# Patient Record
Sex: Male | Born: 1955 | Race: White | Hispanic: No | Marital: Married | Smoking: Never smoker
Health system: Southern US, Community
[De-identification: ages and names within clinical notes are randomized; demographics above are authoritative.]

## PROBLEM LIST (undated history)

## (undated) DIAGNOSIS — K649 Unspecified hemorrhoids: Secondary | ICD-10-CM

## (undated) DIAGNOSIS — C61 Malignant neoplasm of prostate: Secondary | ICD-10-CM

## (undated) DIAGNOSIS — K219 Gastro-esophageal reflux disease without esophagitis: Secondary | ICD-10-CM

## (undated) DIAGNOSIS — R200 Anesthesia of skin: Secondary | ICD-10-CM

## (undated) DIAGNOSIS — J302 Other seasonal allergic rhinitis: Secondary | ICD-10-CM

## (undated) DIAGNOSIS — F419 Anxiety disorder, unspecified: Secondary | ICD-10-CM

## (undated) DIAGNOSIS — R202 Paresthesia of skin: Secondary | ICD-10-CM

## (undated) DIAGNOSIS — I10 Essential (primary) hypertension: Secondary | ICD-10-CM

## (undated) DIAGNOSIS — R7989 Other specified abnormal findings of blood chemistry: Secondary | ICD-10-CM

## (undated) DIAGNOSIS — E7800 Pure hypercholesterolemia, unspecified: Secondary | ICD-10-CM

## (undated) DIAGNOSIS — E039 Hypothyroidism, unspecified: Secondary | ICD-10-CM

## (undated) DIAGNOSIS — J45909 Unspecified asthma, uncomplicated: Secondary | ICD-10-CM

## (undated) HISTORY — DX: Malignant neoplasm of prostate: C61

## (undated) HISTORY — PX: COLONOSCOPY W/ BIOPSIES AND POLYPECTOMY: SHX1376

## (undated) HISTORY — PX: HERNIA REPAIR: SHX51

---

## 2013-06-23 ENCOUNTER — Other Ambulatory Visit: Payer: Self-pay | Admitting: Neurosurgery

## 2013-07-07 ENCOUNTER — Encounter (HOSPITAL_COMMUNITY): Payer: Self-pay | Admitting: Pharmacy Technician

## 2013-07-08 ENCOUNTER — Encounter (HOSPITAL_COMMUNITY)
Admission: RE | Admit: 2013-07-08 | Discharge: 2013-07-08 | Disposition: A | Discharge status: Home / Self Care | Payer: Worker's Compensation | Source: Ambulatory Visit | Attending: Anesthesiology | Admitting: Anesthesiology

## 2013-07-08 ENCOUNTER — Encounter (HOSPITAL_COMMUNITY): Payer: Self-pay

## 2013-07-08 ENCOUNTER — Encounter (HOSPITAL_COMMUNITY)
Admission: RE | Admit: 2013-07-08 | Discharge: 2013-07-08 | Disposition: A | Discharge status: Home / Self Care | Payer: Worker's Compensation | Source: Ambulatory Visit | Attending: Neurosurgery | Admitting: Neurosurgery

## 2013-07-08 DIAGNOSIS — Z01810 Encounter for preprocedural cardiovascular examination: Secondary | ICD-10-CM | POA: Insufficient documentation

## 2013-07-08 DIAGNOSIS — Z01812 Encounter for preprocedural laboratory examination: Secondary | ICD-10-CM | POA: Insufficient documentation

## 2013-07-08 DIAGNOSIS — Z01818 Encounter for other preprocedural examination: Secondary | ICD-10-CM | POA: Insufficient documentation

## 2013-07-08 HISTORY — DX: Other specified abnormal findings of blood chemistry: R79.89

## 2013-07-08 HISTORY — DX: Essential (primary) hypertension: I10

## 2013-07-08 HISTORY — DX: Anesthesia of skin: R20.2

## 2013-07-08 HISTORY — DX: Pure hypercholesterolemia, unspecified: E78.00

## 2013-07-08 HISTORY — DX: Anxiety disorder, unspecified: F41.9

## 2013-07-08 HISTORY — DX: Anesthesia of skin: R20.0

## 2013-07-08 HISTORY — DX: Unspecified asthma, uncomplicated: J45.909

## 2013-07-08 HISTORY — DX: Unspecified hemorrhoids: K64.9

## 2013-07-08 HISTORY — DX: Other seasonal allergic rhinitis: J30.2

## 2013-07-08 HISTORY — DX: Hypothyroidism, unspecified: E03.9

## 2013-07-08 HISTORY — DX: Gastro-esophageal reflux disease without esophagitis: K21.9

## 2013-07-08 LAB — CBC
Hemoglobin: 17.6 g/dL — ABNORMAL HIGH (ref 13.0–17.0)
MCH: 31.8 pg (ref 26.0–34.0)
MCHC: 36.6 g/dL — ABNORMAL HIGH (ref 30.0–36.0)
MCV: 87 fL (ref 78.0–100.0)
Platelets: 239 10*3/uL (ref 150–400)

## 2013-07-08 LAB — BASIC METABOLIC PANEL
BUN: 12 mg/dL (ref 6–23)
CO2: 24 mEq/L (ref 19–32)
Calcium: 9.2 mg/dL (ref 8.4–10.5)
GFR calc non Af Amer: >90 mL/min (ref >90)
Glucose, Bld: 117 mg/dL — ABNORMAL HIGH (ref 70–99)
Potassium: 3.9 mEq/L (ref 3.5–5.1)

## 2013-07-08 NOTE — Progress Notes (Signed)
Pt denies SOB, chest pain, and being under the care of  a cardiologist. Pt denies having a chest x ray and EKG within the last year. Pt states that he had a stress test 10 years ago at [facility] but denies having any other cardiac studies. Pt PCP made aware of STOP-BANG Assessment tool results.

## 2013-07-08 NOTE — Pre-Procedure Instructions (Signed)
Jerry Meza  07/08/2013   Your procedure is scheduled on:  Friday, July 18, 2013  Report to [facility] Short Stay (use Main Entrance "A'') at 5:30 AM.  Call this number if you have problems the morning of surgery: [phone]   Remember:   Do not eat food or drink liquids after midnight.   Take these medicines the morning of surgery with A SIP OF WATER: citalopram (CELEXA) 20 MG tablet, fexofenadine (ALLEGRA) 180 MG tablet, levothyroxine (SYNTHROID, LEVOTHROID) 25 MCG tablet,  if needed:  HYDROcodone-acetaminophen (NORCO/VICODIN) 5-325 MG per tablet for pain, fluticasone (FLONASE) 50 MCG/ACT nasal spray Stop taking Aspirin Multivitamins, and herbal medications. Do not take any NSAIDs ie: Ibuprofen, Advil, Naproxen or any medication containing Aspirin  5 days prior to surgery.   Do not wear jewelry, make-up or nail polish.  Do not wear lotions, powders, or perfumes. You may wear deodorant.  Do not shave 48 hours prior to surgery. Men may shave face and neck.  Do not bring valuables to the hospital.  [facility] is not responsible for any belongings or valuables.               Contacts, dentures or bridgework may not be worn into surgery.  Leave suitcase in the car. After surgery it may be brought to your room.  For patients admitted to the hospital, discharge time is determined by your treatment team.               Patients discharged the day of surgery will not be allowed to drive home.  Name and phone number of your driver:   Special Instructions: Shower using CHG 2 nights before surgery and the night before surgery.  If you shower the day of surgery use CHG.  Use special wash - you have one bottle of CHG for all showers.  You should use approximately 1/3 of the bottle for each shower.   Please read over the following fact sheets that you were given: Pain Booklet, Coughing and Deep Breathing, MRSA Information and Surgical Site Infection Prevention

## 2013-07-08 NOTE — Progress Notes (Signed)
Pt chart left for [place], [place] (anesthesia) to review abnormal labs hemoglobin 17.6

## 2013-07-09 NOTE — Progress Notes (Signed)
Anesthesia Chart Review:  Patient is a 57 year old male scheduled for C4-5, C5-6, C6-7 ACDF on 07/18/13 by Dr. Gerlene Fee.  History includes non-smoker, HTN, asthma, hypercholesterolemia, hypothyroidism, anxiety, GERD, hernia repair, seasonal allergies, low testosterone, obesity.  PCP is listed as Gaye Alken, NP.  EKG on 07/08/13 showed NSR, low voltage QRS.  CXR report on 07/08/13 showed: The heart size and pulmonary vascularity are normal. No infiltrates or effusions. Slight peribronchial thickening suggestive of bronchitis which could be acute or chronic. No acute osseous abnormality. (At PAT he denies SOB. Respirations 20, O2 sat 97%, afebrile.)  Preoperative labs noted.  He will be evaluated by her assigned anesthesiologist on the day of surgery, but if not acute cardiopulmonary issues then I would anticipate that he could proceed as planned.  Velna Ochs [facility] Short Stay Center/Anesthesiology Phone [phone] 07/09/2013 2:00 PM

## 2013-07-17 MED ORDER — CEFAZOLIN SODIUM-DEXTROSE 2-3 GM-% IV SOLR
2.0000 g | INTRAVENOUS | Status: DC
  Filled 2013-07-17: qty 50

## 2013-07-18 ENCOUNTER — Encounter (HOSPITAL_COMMUNITY)
Admission: RE | Disposition: A | Discharge status: Home / Self Care | Payer: Self-pay | Source: Ambulatory Visit | Attending: Neurosurgery

## 2013-07-18 ENCOUNTER — Inpatient Hospital Stay (HOSPITAL_COMMUNITY)
Admission: RE | Admit: 2013-07-18 | Discharge: 2013-07-19 | DRG: 473 | Disposition: A | Discharge status: Home / Self Care | Payer: Worker's Compensation | Source: Ambulatory Visit | Attending: Neurosurgery | Admitting: Neurosurgery

## 2013-07-18 ENCOUNTER — Inpatient Hospital Stay (HOSPITAL_COMMUNITY): Payer: Worker's Compensation

## 2013-07-18 ENCOUNTER — Inpatient Hospital Stay (HOSPITAL_COMMUNITY): Payer: Worker's Compensation | Admitting: Anesthesiology

## 2013-07-18 ENCOUNTER — Encounter (HOSPITAL_COMMUNITY): Payer: Worker's Compensation | Admitting: Vascular Surgery

## 2013-07-18 ENCOUNTER — Encounter (HOSPITAL_COMMUNITY): Payer: Self-pay | Admitting: *Deleted

## 2013-07-18 DIAGNOSIS — M5020 Other cervical disc displacement, unspecified cervical region: Secondary | ICD-10-CM | POA: Diagnosis present

## 2013-07-18 DIAGNOSIS — Z880 Allergy status to penicillin: Secondary | ICD-10-CM

## 2013-07-18 DIAGNOSIS — K219 Gastro-esophageal reflux disease without esophagitis: Secondary | ICD-10-CM | POA: Diagnosis present

## 2013-07-18 DIAGNOSIS — E7800 Pure hypercholesterolemia, unspecified: Secondary | ICD-10-CM | POA: Diagnosis present

## 2013-07-18 DIAGNOSIS — F411 Generalized anxiety disorder: Secondary | ICD-10-CM | POA: Diagnosis present

## 2013-07-18 DIAGNOSIS — Z79899 Other long term (current) drug therapy: Secondary | ICD-10-CM

## 2013-07-18 DIAGNOSIS — E039 Hypothyroidism, unspecified: Secondary | ICD-10-CM | POA: Diagnosis present

## 2013-07-18 DIAGNOSIS — M47812 Spondylosis without myelopathy or radiculopathy, cervical region: Principal | ICD-10-CM | POA: Diagnosis present

## 2013-07-18 DIAGNOSIS — Z820 Family history of epilepsy and other diseases of the nervous system: Secondary | ICD-10-CM

## 2013-07-18 DIAGNOSIS — I10 Essential (primary) hypertension: Secondary | ICD-10-CM | POA: Diagnosis present

## 2013-07-18 DIAGNOSIS — F17200 Nicotine dependence, unspecified, uncomplicated: Secondary | ICD-10-CM | POA: Diagnosis present

## 2013-07-18 DIAGNOSIS — Z23 Encounter for immunization: Secondary | ICD-10-CM

## 2013-07-18 HISTORY — PX: ANTERIOR CERVICAL DECOMP/DISCECTOMY FUSION: SHX1161

## 2013-07-18 SURGERY — ANTERIOR CERVICAL DECOMPRESSION/DISCECTOMY FUSION 3 LEVELS
Anesthesia: General | Site: Spine Cervical | Wound class: Clean

## 2013-07-18 MED ORDER — SODIUM CHLORIDE 0.9 % IJ SOLN
3.0000 mL | Freq: Two times a day (BID) | INTRAMUSCULAR | Status: DC
  Administered 2013-07-18 – 2013-07-19 (×2): 3 mL via INTRAVENOUS

## 2013-07-18 MED ORDER — HYDROMORPHONE HCL PF 1 MG/ML IJ SOLN
0.2500 mg | INTRAMUSCULAR | Status: DC | PRN
  Administered 2013-07-18: 1 mg via INTRAVENOUS

## 2013-07-18 MED ORDER — THROMBIN 20000 UNITS EX SOLR
CUTANEOUS | Status: DC | PRN
  Administered 2013-07-18: 09:00:00 via TOPICAL

## 2013-07-18 MED ORDER — ONDANSETRON HCL 4 MG/2ML IJ SOLN
INTRAMUSCULAR | Status: DC | PRN
  Administered 2013-07-18: 4 mg via INTRAMUSCULAR

## 2013-07-18 MED ORDER — ACETAMINOPHEN 650 MG RE SUPP: 650.0000 mg | RECTAL | Status: DC | PRN

## 2013-07-18 MED ORDER — OXYCODONE HCL 5 MG PO TABS
ORAL_TABLET | ORAL | Status: AC
  Filled 2013-07-18: qty 1

## 2013-07-18 MED ORDER — ATORVASTATIN CALCIUM 40 MG PO TABS
40.0000 mg | ORAL_TABLET | Freq: Every day | ORAL | Status: DC
  Administered 2013-07-18: 40 mg via ORAL
  Filled 2013-07-18 (×2): qty 1

## 2013-07-18 MED ORDER — ARTIFICIAL TEARS OP OINT
TOPICAL_OINTMENT | OPHTHALMIC | Status: DC | PRN
  Administered 2013-07-18: 1 via OPHTHALMIC

## 2013-07-18 MED ORDER — VANCOMYCIN HCL IN DEXTROSE 1-5 GM/200ML-% IV SOLN
INTRAVENOUS | Status: AC
  Administered 2013-07-18: 1000 mg via INTRAVENOUS
  Filled 2013-07-18: qty 200

## 2013-07-18 MED ORDER — MIDAZOLAM HCL 5 MG/5ML IJ SOLN
INTRAMUSCULAR | Status: DC | PRN
  Administered 2013-07-18: 2 mg via INTRAVENOUS

## 2013-07-18 MED ORDER — MENTHOL 3 MG MT LOZG: 1.0000 | LOZENGE | OROMUCOSAL | Status: DC | PRN

## 2013-07-18 MED ORDER — VANCOMYCIN HCL IN DEXTROSE 1-5 GM/200ML-% IV SOLN
1000.0000 mg | Freq: Two times a day (BID) | INTRAVENOUS | Status: DC
  Administered 2013-07-18 (×2): 1000 mg via INTRAVENOUS
  Filled 2013-07-18 (×5): qty 200

## 2013-07-18 MED ORDER — DEXAMETHASONE 4 MG PO TABS
4.0000 mg | ORAL_TABLET | Freq: Four times a day (QID) | ORAL | Status: AC
  Administered 2013-07-18 (×2): 4 mg via ORAL
  Filled 2013-07-18 (×2): qty 1

## 2013-07-18 MED ORDER — 0.9 % SODIUM CHLORIDE (POUR BTL) OPTIME
TOPICAL | Status: DC | PRN
  Administered 2013-07-18: 1000 mL

## 2013-07-18 MED ORDER — OXYCODONE HCL 5 MG/5ML PO SOLN: 5.0000 mg | Freq: Once | ORAL | Status: AC | PRN

## 2013-07-18 MED ORDER — LIDOCAINE HCL (CARDIAC) 20 MG/ML IV SOLN
INTRAVENOUS | Status: DC | PRN
  Administered 2013-07-18: 100 mg via INTRAVENOUS

## 2013-07-18 MED ORDER — SODIUM CHLORIDE 0.9 % IR SOLN
Status: DC | PRN
  Administered 2013-07-18: 09:00:00

## 2013-07-18 MED ORDER — PROPOFOL 10 MG/ML IV BOLUS
INTRAVENOUS | Status: DC | PRN
  Administered 2013-07-18: 200 mg via INTRAVENOUS

## 2013-07-18 MED ORDER — INFLUENZA VAC SPLIT QUAD 0.5 ML IM SUSP
0.5000 mL | INTRAMUSCULAR | Status: AC
  Administered 2013-07-19: 0.5 mL via INTRAMUSCULAR
  Filled 2013-07-18: qty 0.5

## 2013-07-18 MED ORDER — CITALOPRAM HYDROBROMIDE 20 MG PO TABS
20.0000 mg | ORAL_TABLET | Freq: Every day | ORAL | Status: DC
  Administered 2013-07-18 – 2013-07-19 (×2): 20 mg via ORAL
  Filled 2013-07-18 (×2): qty 1

## 2013-07-18 MED ORDER — ROCURONIUM BROMIDE 100 MG/10ML IV SOLN
INTRAVENOUS | Status: DC | PRN
  Administered 2013-07-18: 50 mg via INTRAVENOUS
  Administered 2013-07-18 (×3): 20 mg via INTRAVENOUS
  Administered 2013-07-18: 10 mg via INTRAVENOUS
  Administered 2013-07-18: 20 mg via INTRAVENOUS

## 2013-07-18 MED ORDER — ACETAMINOPHEN 325 MG PO TABS: 650.0000 mg | ORAL_TABLET | ORAL | Status: DC | PRN

## 2013-07-18 MED ORDER — ZOLPIDEM TARTRATE 5 MG PO TABS: 5.0000 mg | ORAL_TABLET | Freq: Every evening | ORAL | Status: DC | PRN

## 2013-07-18 MED ORDER — CYCLOBENZAPRINE HCL 10 MG PO TABS
ORAL_TABLET | ORAL | Status: AC
  Filled 2013-07-18: qty 1

## 2013-07-18 MED ORDER — LEVOTHYROXINE SODIUM 25 MCG PO TABS
25.0000 ug | ORAL_TABLET | Freq: Every day | ORAL | Status: DC
  Administered 2013-07-18 – 2013-07-19 (×2): 25 ug via ORAL
  Filled 2013-07-18 (×3): qty 1

## 2013-07-18 MED ORDER — METOCLOPRAMIDE HCL 5 MG/ML IJ SOLN: 10.0000 mg | Freq: Once | INTRAMUSCULAR | Status: DC | PRN

## 2013-07-18 MED ORDER — CYCLOBENZAPRINE HCL 10 MG PO TABS
10.0000 mg | ORAL_TABLET | Freq: Three times a day (TID) | ORAL | Status: DC | PRN
  Administered 2013-07-18 – 2013-07-19 (×3): 10 mg via ORAL
  Filled 2013-07-18 (×2): qty 1

## 2013-07-18 MED ORDER — SODIUM CHLORIDE 0.9 % IJ SOLN: 3.0000 mL | INTRAMUSCULAR | Status: DC | PRN

## 2013-07-18 MED ORDER — PHENOL 1.4 % MT LIQD: 1.0000 | OROMUCOSAL | Status: DC | PRN

## 2013-07-18 MED ORDER — DEXAMETHASONE SODIUM PHOSPHATE 4 MG/ML IJ SOLN: 4.0000 mg | Freq: Four times a day (QID) | INTRAMUSCULAR | Status: AC

## 2013-07-18 MED ORDER — SODIUM CHLORIDE 0.9 % IV SOLN: 250.0000 mL | INTRAVENOUS | Status: DC

## 2013-07-18 MED ORDER — HYDROCODONE-ACETAMINOPHEN 5-325 MG PO TABS
1.0000 | ORAL_TABLET | ORAL | Status: DC | PRN
  Administered 2013-07-18 – 2013-07-19 (×7): 2 via ORAL
  Filled 2013-07-18 (×6): qty 2

## 2013-07-18 MED ORDER — HYDROCODONE-ACETAMINOPHEN 5-325 MG PO TABS
ORAL_TABLET | ORAL | Status: AC
  Filled 2013-07-18: qty 2

## 2013-07-18 MED ORDER — GLYCOPYRROLATE 0.2 MG/ML IJ SOLN
INTRAMUSCULAR | Status: DC | PRN
  Administered 2013-07-18: .8 mg via INTRAVENOUS

## 2013-07-18 MED ORDER — LISINOPRIL 5 MG PO TABS
5.0000 mg | ORAL_TABLET | Freq: Every day | ORAL | Status: DC
  Administered 2013-07-18 – 2013-07-19 (×2): 5 mg via ORAL
  Filled 2013-07-18 (×2): qty 1

## 2013-07-18 MED ORDER — OXYCODONE HCL 5 MG PO TABS
5.0000 mg | ORAL_TABLET | Freq: Once | ORAL | Status: AC | PRN
  Administered 2013-07-18: 5 mg via ORAL

## 2013-07-18 MED ORDER — ONDANSETRON HCL 4 MG/2ML IJ SOLN: 4.0000 mg | INTRAMUSCULAR | Status: DC | PRN

## 2013-07-18 MED ORDER — NEOSTIGMINE METHYLSULFATE 1 MG/ML IJ SOLN
INTRAMUSCULAR | Status: DC | PRN
  Administered 2013-07-18: 5 mg via INTRAVENOUS

## 2013-07-18 MED ORDER — SUCCINYLCHOLINE CHLORIDE 20 MG/ML IJ SOLN
INTRAMUSCULAR | Status: DC | PRN
  Administered 2013-07-18: 100 mg via INTRAVENOUS

## 2013-07-18 MED ORDER — KCL IN DEXTROSE-NACL 20-5-0.45 MEQ/L-%-% IV SOLN
80.0000 mL/h | INTRAVENOUS | Status: DC
  Administered 2013-07-18: 80 mL/h via INTRAVENOUS
  Filled 2013-07-18 (×4): qty 1000

## 2013-07-18 MED ORDER — LACTATED RINGERS IV SOLN
INTRAVENOUS | Status: DC | PRN
  Administered 2013-07-18 (×3): via INTRAVENOUS

## 2013-07-18 MED ORDER — FENTANYL CITRATE 0.05 MG/ML IJ SOLN
INTRAMUSCULAR | Status: DC | PRN
  Administered 2013-07-18 (×2): 50 ug via INTRAVENOUS
  Administered 2013-07-18: 100 ug via INTRAVENOUS
  Administered 2013-07-18 (×3): 50 ug via INTRAVENOUS
  Administered 2013-07-18: 100 ug via INTRAVENOUS
  Administered 2013-07-18: 50 ug via INTRAVENOUS

## 2013-07-18 MED ORDER — DEXAMETHASONE SODIUM PHOSPHATE 4 MG/ML IJ SOLN
INTRAMUSCULAR | Status: DC | PRN
  Administered 2013-07-18: 10 mg via INTRAVENOUS

## 2013-07-18 MED ORDER — HYDROMORPHONE HCL PF 1 MG/ML IJ SOLN
INTRAMUSCULAR | Status: AC
  Filled 2013-07-18: qty 1

## 2013-07-18 MED ORDER — PANTOPRAZOLE SODIUM 40 MG IV SOLR
40.0000 mg | Freq: Every day | INTRAVENOUS | Status: DC
  Administered 2013-07-18: 40 mg via INTRAVENOUS
  Filled 2013-07-18 (×2): qty 40

## 2013-07-18 MED ORDER — HYDROMORPHONE HCL PF 1 MG/ML IJ SOLN: 1.0000 mg | INTRAMUSCULAR | Status: DC | PRN

## 2013-07-18 SURGICAL SUPPLY — 66 items
BAG DECANTER FOR FLEXI CONT (MISCELLANEOUS) ×2 IMPLANT
BENZOIN TINCTURE PRP APPL 2/3 (GAUZE/BANDAGES/DRESSINGS) ×2 IMPLANT
BIT DRILL TRINICA 2.3MM (BIT) ×1 IMPLANT
BRUSH SCRUB EZ PLAIN DRY (MISCELLANEOUS) ×2 IMPLANT
BUR MATCHSTICK NEURO 3.0 LAGG (BURR) ×2 IMPLANT
CANISTER SUCTION 2500CC (MISCELLANEOUS) ×2 IMPLANT
CLOTH BEACON ORANGE TIMEOUT ST (SAFETY) IMPLANT
CONT SPEC 4OZ CLIKSEAL STRL BL (MISCELLANEOUS) ×2 IMPLANT
DRAIN SNY WOU 7FLT (WOUND CARE) ×2 IMPLANT
DRAPE C-ARM 42X72 X-RAY (DRAPES) ×4 IMPLANT
DRAPE LAPAROTOMY 100X72 PEDS (DRAPES) ×2 IMPLANT
DRAPE MICROSCOPE LEICA (MISCELLANEOUS) ×2 IMPLANT
DRAPE MICROSCOPE ZEISS OPMI (DRAPES) IMPLANT
DRAPE POUCH INSTRU U-SHP 10X18 (DRAPES) ×2 IMPLANT
DRAPE SURG 17X23 STRL (DRAPES) ×4 IMPLANT
DRESSING TELFA 8X3 (GAUZE/BANDAGES/DRESSINGS) IMPLANT
DRILL BIT TRINICA 2.3MM (BIT) ×2
DRSG OPSITE POSTOP 4X6 (GAUZE/BANDAGES/DRESSINGS) ×2 IMPLANT
DURAPREP 6ML APPLICATOR 50/CS (WOUND CARE) ×2 IMPLANT
ELECT COATED BLADE 2.86 ST (ELECTRODE) ×2 IMPLANT
ELECT REM PT RETURN 9FT ADLT (ELECTROSURGICAL) ×2
ELECTRODE REM PT RTRN 9FT ADLT (ELECTROSURGICAL) ×1 IMPLANT
EVACUATOR SILICONE 100CC (DRAIN) ×2 IMPLANT
GAUZE SPONGE 4X4 16PLY XRAY LF (GAUZE/BANDAGES/DRESSINGS) IMPLANT
GLOVE BIOGEL PI IND STRL 7.0 (GLOVE) ×2 IMPLANT
GLOVE BIOGEL PI IND STRL 8 (GLOVE) ×1 IMPLANT
GLOVE BIOGEL PI INDICATOR 7.0 (GLOVE) ×2
GLOVE BIOGEL PI INDICATOR 8 (GLOVE) ×1
GLOVE ECLIPSE 6.5 STRL STRAW (GLOVE) ×2 IMPLANT
GLOVE ECLIPSE 7.5 STRL STRAW (GLOVE) ×6 IMPLANT
GLOVE ECLIPSE 8.0 STRL XLNG CF (GLOVE) ×2 IMPLANT
GLOVE ECLIPSE 8.5 STRL (GLOVE) ×2 IMPLANT
GLOVE EXAM NITRILE LRG STRL (GLOVE) IMPLANT
GLOVE EXAM NITRILE XL STR (GLOVE) IMPLANT
GLOVE EXAM NITRILE XS STR PU (GLOVE) IMPLANT
GOWN BRE IMP SLV AUR LG STRL (GOWN DISPOSABLE) ×4 IMPLANT
GOWN BRE IMP SLV AUR XL STRL (GOWN DISPOSABLE) IMPLANT
GOWN STRL REIN 2XL LVL4 (GOWN DISPOSABLE) ×2 IMPLANT
HEAD HALTER (SOFTGOODS) ×2 IMPLANT
INTERBODY TM 11X14X7-7DEG ANG (Metal Cage) ×4 IMPLANT
KIT BASIN OR (CUSTOM PROCEDURE TRAY) ×2 IMPLANT
KIT ROOM TURNOVER OR (KITS) ×2 IMPLANT
NEEDLE SPNL 20GX3.5 QUINCKE YW (NEEDLE) ×2 IMPLANT
NS IRRIG 1000ML POUR BTL (IV SOLUTION) ×2 IMPLANT
PACK LAMINECTOMY NEURO (CUSTOM PROCEDURE TRAY) ×2 IMPLANT
PAD ARMBOARD 7.5X6 YLW CONV (MISCELLANEOUS) ×8 IMPLANT
PATTIES SURGICAL .25X.25 (GAUZE/BANDAGES/DRESSINGS) IMPLANT
PATTIES SURGICAL .75X.75 (GAUZE/BANDAGES/DRESSINGS) IMPLANT
PLATE TRINICA 39MM-80 LUMBAR (Plate) ×2 IMPLANT
PUTTY BONE GRAFT KIT 2.5ML (Bone Implant) ×2 IMPLANT
RUBBERBAND STERILE (MISCELLANEOUS) ×4 IMPLANT
SCREW SELF DRILL FIXED 14MM (Screw) ×8 IMPLANT
SCREW SELF DRILL VAR 14MMX4.2 (Screw) ×8 IMPLANT
SPACER TMS 11X14X6MM (Spacer) ×2 IMPLANT
SPONGE GAUZE 4X4 12PLY (GAUZE/BANDAGES/DRESSINGS) ×2 IMPLANT
SPONGE INTESTINAL PEANUT (DISPOSABLE) ×2 IMPLANT
SPONGE SURGIFOAM ABS GEL 100 (HEMOSTASIS) ×2 IMPLANT
STRIP CLOSURE SKIN 1/2X4 (GAUZE/BANDAGES/DRESSINGS) ×2 IMPLANT
SUT PDS AB 5-0 P3 18 (SUTURE) ×2 IMPLANT
SUT VIC AB 3-0 CP2 18 (SUTURE) ×2 IMPLANT
SYR 20ML ECCENTRIC (SYRINGE) ×2 IMPLANT
TOWEL OR 17X24 6PK STRL BLUE (TOWEL DISPOSABLE) ×2 IMPLANT
TOWEL OR 17X26 10 PK STRL BLUE (TOWEL DISPOSABLE) ×2 IMPLANT
TRAP SPECIMEN MUCOUS 40CC (MISCELLANEOUS) ×2 IMPLANT
TRAY FOLEY CATH 16FRSI W/METER (SET/KITS/TRAYS/PACK) ×2 IMPLANT
WATER STERILE IRR 1000ML POUR (IV SOLUTION) ×2 IMPLANT

## 2013-07-18 NOTE — Anesthesia Postprocedure Evaluation (Signed)
Anesthesia Post Note  Patient: Jerry Meza  Procedure(s) Performed: Procedure(s) (LRB): CERVICAL FOUR-FIVE,CERVICALFIVE-SIX,CERVICAL SIX-SEVEN ANTERIOR CERVICAL DECOMPRESSION /DISCECTOMY/FUSION (N/A)  Anesthesia type: General  Patient location: PACU  Post pain: Pain level controlled  Post assessment: Patient's Cardiovascular Status Stable  Last Vitals:  Filed Vitals:   07/18/13 1200  BP: 145/100  Pulse: 107  Temp:   Resp: 12    Post vital signs: Reviewed and stable  Level of consciousness: alert  Complications: No apparent anesthesia complications

## 2013-07-18 NOTE — Progress Notes (Signed)
ANTIBIOTIC CONSULT NOTE - INITIAL  Pharmacy Consult for vancomycin Indication: surgical prophylaxis while prevertebral drain in place   Allergies  Allergen Reactions  . Penicillins Hives    Patient Measurements:     Vital Signs: Temp: 98.4 F (36.9 C) (10/10 1244) Temp src: Oral (10/10 1244) BP: 123/87 mmHg (10/10 1244) Pulse Rate: 118 (10/10 1244) Intake/Output from previous day:   Intake/Output from this shift: Total I/O In: 2000 [I.V.:2000] Out: 437 [Urine:345; Drains:17; Blood:75]  Labs: No results found for this basename: WBC, HGB, PLT, LABCREA, CREATININE,  in the last 72 hours CrCl is unknown because there is no height on file for the current visit. No results found for this basename: VANCOTROUGH, Leodis Binet, VANCORANDOM, GENTTROUGH, GENTPEAK, GENTRANDOM, TOBRATROUGH, TOBRAPEAK, TOBRARND, AMIKACINPEAK, AMIKACINTROU, AMIKACIN,  in the last 72 hours   Microbiology: Recent Results (from the past 720 hour(s))  SURGICAL PCR SCREEN     Status: Abnormal   Collection Time    07/08/13  2:32 PM      Result Value Range Status   MRSA, PCR NEGATIVE  NEGATIVE Final   Staphylococcus aureus POSITIVE (*) NEGATIVE Final   Comment:            The Xpert SA Assay (FDA     approved for NASAL specimens     in patients over 69 years of age),     is one component of     a comprehensive surveillance     program.  Test performance has     been validated by [employer] for patients greater     than or equal to 60 year old.     It is not intended     to diagnose infection nor to     guide or monitor treatment.    Medical History: Past Medical History  Diagnosis Date  . Hypertension   . High cholesterol     Hx: of  . Asthma     Hx: of as a child  . Seasonal allergies     Hx: of  . Numbness and tingling in right hand     Hx: of  . Hemorrhoids     Hx: of  . Hypothyroidism   . Low testosterone     Hx; of  . Anxiety   . GERD (gastroesophageal reflux disease)     Assessment: 57 year old male s/p spinal surgery with prevertebral drain left during surgery. Vancomycin received this am, will continue vancomycin with bid dosing as renal function is normal.  Goal of Therapy:  Vancomycin trough level 10-15 mcg/ml  Plan:  Measure antibiotic drug levels at steady state Follow up culture results Vancomycin 1g IV q12 hours  Sheppard Coil PharmD., BCPS Clinical Pharmacist Pager [phone] 07/18/2013 12:56 PM

## 2013-07-18 NOTE — Op Note (Signed)
Preop diagnosis: Herniated disc and spinal stenosis C4-5 C5-6 C6-7 Postop diagnosis: Same Procedure: C4-5 C5-6 C6-7 decompressive anterior cervical discectomy with trabecular metal interbody fusion and Trinica anterior cervical plating Surgeon: Roseanna Koplin Assistant: Cabbell  After and placed in the supine position and 10 pounds halter traction the patient's neck was prepped and draped in usual sterile fashion. Localizing fluoroscopy was used prior to incision to identify the appropriate level. Vertical incision was made along the border between the strap muscles the sternocleidomastoid muscle. The platysma muscle was incised along the line the incision. The natural fascial plane between the strap muscles medially and the sternal cremaster laterally was identified and followed down to the anterior aspect the cervical spine. Longus Cole muscles were identified split in the midline and stripped away bilaterally with unipolar coagulation and Kitner dissection. Self-retaining tract was placed for exposure and x-ray showed approach the appropriate levels. Using a 15 blade the S. the discs was incised. Using pituitary rongeurs and curettes approximately 90% of the disc material was removed at all levels. High-speed drill was used to widen the interspace and bony shavings were saved for use later in the case. At this time the microscope was draped brought in the field and used for the remainder of the case. Starting C6-7 the remainder of the disc material down the posterior longitudinal ligament was removed. Ligament was then incised transversely and the cut edges removed a Kerrison punch. Large amounts of herniated disc which were identified pushed on the spinal dura and under the C7 nerve root particularly towards the right spermatic side. Generous decompression was carried out until the spinal dura and C7 nerve roots well visualized well decompressed bilaterally. Attention was then turned C5-6 were similar  decompression was carried out. Once again generous decompression was carried out on the spinal dura into the foramen bilaterally until the C6 nerve roots were identified. Large amounts of herniated disc material to level as well. Similar decompression was carried out at C4-5 and inspection was carried out in all directions at all 3 levels for any evidence of residual compression and none could be identified. The nerve roots particularly towards illicit backside extremely well decompressed and visualized as was the spinal dura. Irrigation was carried out and any bleeding control proper duration Gelfoam. Measurements were taken and one 6 mm to 7 mm lordotic graft were chosen. These were filled with a mixture of autologous bone morselized allograft and impacted the disc space after hemostasis had been confirmed. The small graft went at C4-5 and autograft and C5-6 and C6-7. We then chose an appropriately length Trinica anterior cervical plating. Under fluoroscopic guidance drill holes were placed followed by placing of 14 mm fixed screws in C5 and C6 and 14 mm variable screws at C4 and C7. Locking mechanism was rotated locked position final fossae showed good placement of the plates screws and plugs. Irrigation was carried out and any bleeding control proper coagulation. A prevertebral drain was left in the prevertebral space and brought through separate stab incision. The was then closed with inverted Vicryl on the platysma and subcuticular layer. Steri-Strips were placed on the skin. Shortness was then applied the patient was extubated and taken to recovery room in stable condition.

## 2013-07-18 NOTE — Progress Notes (Signed)
Orthopedic Tech Progress Note Patient Details:  Jerry Meza Dec 08, 1955 [id number]  Ortho Devices Type of Ortho Device: Soft collar Ortho Device/Splint Interventions: Application   Shawnie Pons 07/18/2013, 12:33 PM

## 2013-07-18 NOTE — Transfer of Care (Signed)
Immediate Anesthesia Transfer of Care Note  Patient: Jerry Meza  Procedure(s) Performed: Procedure(s): CERVICAL FOUR-FIVE,CERVICALFIVE-SIX,CERVICAL SIX-SEVEN ANTERIOR CERVICAL DECOMPRESSION /DISCECTOMY/FUSION (N/A)  Patient Location: PACU  Anesthesia Type:General  Level of Consciousness: awake, alert  and oriented  Airway & Oxygen Therapy: Patient Spontanous Breathing and Patient connected to face mask oxygen  Post-op Assessment: Report given to PACU RN and Patient moving all extremities X 4  Post vital signs: Reviewed and stable  Complications: No apparent anesthesia complications

## 2013-07-18 NOTE — Anesthesia Preprocedure Evaluation (Addendum)
Anesthesia Evaluation  Patient identified by MRN, date of birth, ID band Patient awake    Reviewed: Allergy & Precautions, H&P , NPO status , Patient's Chart, lab work & pertinent test results, reviewed documented beta blocker date and time   Airway Mallampati: I TM Distance: >3 FB Neck ROM: full    Dental  (+) Teeth Intact, Missing and Dental Advisory Given,    Pulmonary asthma ,  breath sounds clear to auscultation        Cardiovascular hypertension, On Medications Rhythm:regular     Neuro/Psych PSYCHIATRIC DISORDERS Anxiety negative neurological ROS     GI/Hepatic negative GI ROS, Neg liver ROS, GERD-  Medicated and Controlled,  Endo/Other  Hypothyroidism   Renal/GU negative Renal ROS  negative genitourinary   Musculoskeletal   Abdominal   Peds  Hematology negative hematology ROS (+)   Anesthesia Other Findings See surgeon's H&P   Reproductive/Obstetrics negative OB ROS                        Anesthesia Physical Anesthesia Plan  ASA: III  Anesthesia Plan: General   Post-op Pain Management:    Induction: Intravenous  Airway Management Planned: Oral ETT and Video Laryngoscope Planned  Additional Equipment:   Intra-op Plan:   Post-operative Plan: Extubation in OR  Informed Consent: I have reviewed the patients History and Physical, chart, labs and discussed the procedure including the risks, benefits and alternatives for the proposed anesthesia with the patient or authorized representative who has indicated his/her understanding and acceptance.   Dental Advisory Given  Plan Discussed with: CRNA and Surgeon  Anesthesia Plan Comments:         Anesthesia Quick Evaluation

## 2013-07-18 NOTE — Progress Notes (Signed)
Catheter removed at 1620, patient has voided x2, no problems. Pain controlled on vicodin and flexeril. Up ambulating to the bathroom and in the room with standby assistance on POD#0. Neuro intact, 5+ strength. Patient already hopeful for possibly discharging home tomorrow.

## 2013-07-18 NOTE — Anesthesia Procedure Notes (Signed)
Procedure Name: Intubation Date/Time: 07/18/2013 7:43 AM Performed by: De Nurse Pre-anesthesia Checklist: Patient identified, Emergency Drugs available, Suction available, Patient being monitored and Timeout performed Patient Re-evaluated:Patient Re-evaluated prior to inductionOxygen Delivery Method: Circle system utilized Preoxygenation: Pre-oxygenation with 100% oxygen Intubation Type: IV induction and Rapid sequence Laryngoscope Size: Mac Tube type: Oral Tube size: 7.5 mm Number of attempts: 1 Airway Equipment and Method: Stylet and Video-laryngoscopy Placement Confirmation: ETT inserted through vocal cords under direct vision,  positive ETCO2 and breath sounds checked- equal and bilateral Secured at: 23 cm Tube secured with: Tape Dental Injury: Teeth and Oropharynx as per pre-operative assessment  Difficulty Due To: Difficulty was anticipated, Difficult Airway-  due to edematous airway, Difficult Airway- due to large tongue and Difficult Airway- due to limited oral opening Future Recommendations: Recommend- induction with short-acting agent, and alternative techniques readily available

## 2013-07-18 NOTE — Progress Notes (Signed)
UR COMPLETED  

## 2013-07-18 NOTE — Addendum Note (Signed)
Addendum created 07/18/13 1240 by Edmonia Caprio, CRNA   Modules edited: Anesthesia Responsible Staff

## 2013-07-18 NOTE — H&P (Signed)
Jerry Meza is an 57 y.o. male.   Chief Complaint: Neck and right arm pain HPI: The patient is a 57 year old gentleman who was evaluated in the office with neck pain with radiation into the right arm. He currently fell at work back in July of this year the pain started about 1 week later. Start with neck pain with the arm. It was elected an object and felt a sudden pop with pain increased dramatically. Had the problem for a few weeks and went to the local emergency room show medications include prednisone meloxicam oxycodone without relief. He is quite serious was numbness in the thumb index and middle fingers of his right hand. An MRI scan was done he was referred for neurosurgical opinion. MRI scan was reviewed which showed significant disc abnormalities at C4-5 C5-6 and C6-7 which we will his clinical presentation. After discussing the options the patient requested surgery now comes for a three-level anterior cervical discectomy with fusion and plating. I had a long discussion with him regarding the risks and benefits of surgical intervention. The risks discussed include but are not limited to bleeding infection weakness was paralysis spinal fluid leak quadriplegia hoarseness coma and death. We have discussed alternative methods of therapy offered risks and benefits of nonintervention. He's had the opportunity to ask numerous questions and appears to understand. With this information in hand he has requested we proceed with surgery.  Past Medical History  Diagnosis Date  . Hypertension   . High cholesterol     Hx: of  . Asthma     Hx: of as a child  . Seasonal allergies     Hx: of  . Numbness and tingling in right hand     Hx: of  . Hemorrhoids     Hx: of  . Hypothyroidism   . Low testosterone     Hx; of  . Anxiety   . GERD (gastroesophageal reflux disease)     Past Surgical History  Procedure Laterality Date  . Colonoscopy w/ biopsies and polypectomy      Hx; of  . Hernia  repair      Family History  Problem Relation Age of Onset  . Cancer Mother   . Hyperlipidemia Mother   . Cancer Father   . Alzheimer's disease Father   . Cancer Other    Social History:  reports that he has never smoked. He has quit using smokeless tobacco. His smokeless tobacco use included Snuff. He reports that he drinks alcohol. He reports that he does not use illicit drugs.  Allergies:  Allergies  Allergen Reactions  . Penicillins Hives    Medications Prior to Admission  Medication Sig Dispense Refill  . aspirin EC 81 MG tablet Take 81 mg by mouth daily.      Marland Kitchen atorvastatin (LIPITOR) 40 MG tablet Take 40 mg by mouth at bedtime.      . citalopram (CELEXA) 20 MG tablet Take 20 mg by mouth every morning.      . fexofenadine (ALLEGRA) 180 MG tablet Take 180 mg by mouth daily.      . fluticasone (FLONASE) 50 MCG/ACT nasal spray Place 2 sprays into the nose as needed for rhinitis.      Marland Kitchen HYDROcodone-acetaminophen (NORCO/VICODIN) 5-325 MG per tablet Take 1 tablet by mouth every 6 (six) hours as needed for pain.      Marland Kitchen ibuprofen (ADVIL,MOTRIN) 200 MG tablet Take 400 mg by mouth daily as needed for pain.      Marland Kitchen  levothyroxine (SYNTHROID, LEVOTHROID) 25 MCG tablet Take 25 mcg by mouth daily before breakfast.      . lisinopril (PRINIVIL,ZESTRIL) 5 MG tablet Take 5 mg by mouth daily.      . Multiple Vitamin (MULTIVITAMIN) tablet Take 1 tablet by mouth daily.      Marland Kitchen testosterone cypionate (DEPOTESTOTERONE CYPIONATE) 200 MG/ML injection Inject 200 mg into the muscle every 14 (fourteen) days.      . vitamin C (ASCORBIC ACID) 500 MG tablet Take 500 mg by mouth daily.      . Zinc 50 MG TABS Take 1 tablet by mouth daily.        No results found for this or any previous visit (from the past 48 hour(s)). No results found.  Review of systems is positive for nasal congestion drainage sinus problems high blood pressure high cholesterol swelling in his hands asthma as a child anxiety thyroid  disease low testosterone  Blood pressure 137/99, pulse 92, temperature 98.1 F (36.7 C), temperature source Oral, resp. rate 18, SpO2 98.00%.  Neurologic exam shows the patient awake or and oriented. His no facial asymmetry. He has decreased right biceps jerk reflex and triceps jerk reflex versus the left and markedly decreased right tricep muscle strength Assessment/Plan Impression is that of significant disc herniations at C4-5 C5-6 and C6-7. The plan is for a three-level anterior cervical discectomy with fusion and plating.  Reinaldo Meeker, MD 07/18/2013, 7:28 AM

## 2013-07-18 NOTE — Preoperative (Signed)
Beta Blockers   Reason not to administer Beta Blockers:Not Applicable 

## 2013-07-19 MED ORDER — OXYCODONE-ACETAMINOPHEN 10-325 MG PO TABS: 1.0000 | ORAL_TABLET | ORAL | Status: DC | PRN

## 2013-07-19 NOTE — Plan of Care (Signed)
Problem: Consults Goal: Diagnosis - Spinal Surgery Microdiscectomy     

## 2013-07-19 NOTE — Discharge Summary (Signed)
   Physician Discharge Summary  Patient ID: Kolbey Teichert MRN: [id number] DOB/AGE: October 31, 1955 57 y.o.  Admit date: 07/18/2013 Discharge date: 07/19/2013  Admission Diagnoses: Cervical spondylosis C4-7 Discharge Diagnoses: Same Active Problems:   * No active hospital problems. *   Discharged Condition: Stable  Hospital Course:  Mrs. Aleksandr Pellow is a 57 y.o. male was admitted after elective C4-7 ACDF. On POD# 1 he reported resolution of his right arm pain, without new N/T/W. He was ambulating without difficulty, voiding without difficulty, and tolerating diet without significant dysphagia. His drain was monitored during the day and was removed. He was then stable for discharge.  Treatments: Surgery - C4-7 ACDF  Discharge Exam: Blood pressure 122/71, pulse 108, temperature 98.4 F (36.9 C), temperature source Oral, resp. rate 18, SpO2 94.00%. Awake, alert, oriented Speech fluent, appropriate CN grossly intact 5/5 BUE/BLE x 4+/5 right tricep (baseline) Wound c/d/i  Follow-up: Follow-up in Dr. Trudee Grip office [facility] [phone]) in 2-3 weeks  Disposition: Home     Medication List    STOP taking these medications       aspirin EC 81 MG tablet     HYDROcodone-acetaminophen 5-325 MG per tablet  Commonly known as:  NORCO/VICODIN      TAKE these medications       atorvastatin 40 MG tablet  Commonly known as:  LIPITOR  Take 40 mg by mouth at bedtime.     citalopram 20 MG tablet  Commonly known as:  CELEXA  Take 20 mg by mouth every morning.     fexofenadine 180 MG tablet  Commonly known as:  ALLEGRA  Take 180 mg by mouth daily.     fluticasone 50 MCG/ACT nasal spray  Commonly known as:  FLONASE  Place 2 sprays into the nose as needed for rhinitis.     ibuprofen 200 MG tablet  Commonly known as:  ADVIL,MOTRIN  Take 400 mg by mouth daily as needed for pain.     levothyroxine 25 MCG tablet  Commonly known as:  SYNTHROID,  LEVOTHROID  Take 25 mcg by mouth daily before breakfast.     lisinopril 5 MG tablet  Commonly known as:  PRINIVIL,ZESTRIL  Take 5 mg by mouth daily.     multivitamin tablet  Take 1 tablet by mouth daily.     oxyCODONE-acetaminophen 10-325 MG per tablet  Commonly known as:  PERCOCET  Take 1 tablet by mouth every 4 (four) hours as needed for pain.     testosterone cypionate 200 MG/ML injection  Commonly known as:  DEPOTESTOTERONE CYPIONATE  Inject 200 mg into the muscle every 14 (fourteen) days.     vitamin C 500 MG tablet  Commonly known as:  ASCORBIC ACID  Take 500 mg by mouth daily.     Zinc 50 MG Tabs  Take 1 tablet by mouth daily.         SignedLisbeth Renshaw, C 07/19/2013, 10:41 AM

## 2013-07-19 NOTE — Plan of Care (Signed)
Problem: Consults Goal: Diagnosis - Spinal Surgery Cervical Spine Fusion     

## 2013-07-19 NOTE — Progress Notes (Signed)
JP drain output measured 20cc at 4pm.  Pt given discharge instructions and prescriptions.  PIV and drain removed.  Pt denied any complants.  Pt taken to discharge location via wheelchair.

## 2013-07-19 NOTE — Progress Notes (Signed)
No issues overnight. Pt reports resolution of his right arm pain. No significant dysphagia, able to tolerate diet.  EXAM:  BP 122/71  Pulse 108  Temp(Src) 98.4 F (36.9 C) (Oral)  Resp 18  SpO2 94%  Awake, alert, oriented  Speech fluent, appropriate  CN grossly intact  5/5 BUE/BLE  Wound c/d/i drain in place with 117cc output since surgery  IMPRESSION:  57 y.o. male POD# 1 s/p C3-6 ACDF, neurologically well  PLAN: - Observe drain output over till this pm. If < 50cc, can d/c home - Routine postop care.

## 2013-07-28 ENCOUNTER — Encounter (HOSPITAL_COMMUNITY): Payer: Self-pay | Admitting: Neurosurgery

## 2014-12-11 IMAGING — CR DG CHEST 2V
2 series · 2 of 2 positions shown · non-contrast
Comparison: None.

CLINICAL DATA: Cervical disc protrusions. Pre operative respiratory
exam.

EXAM:
CHEST  2 VIEW

[w chest pa]
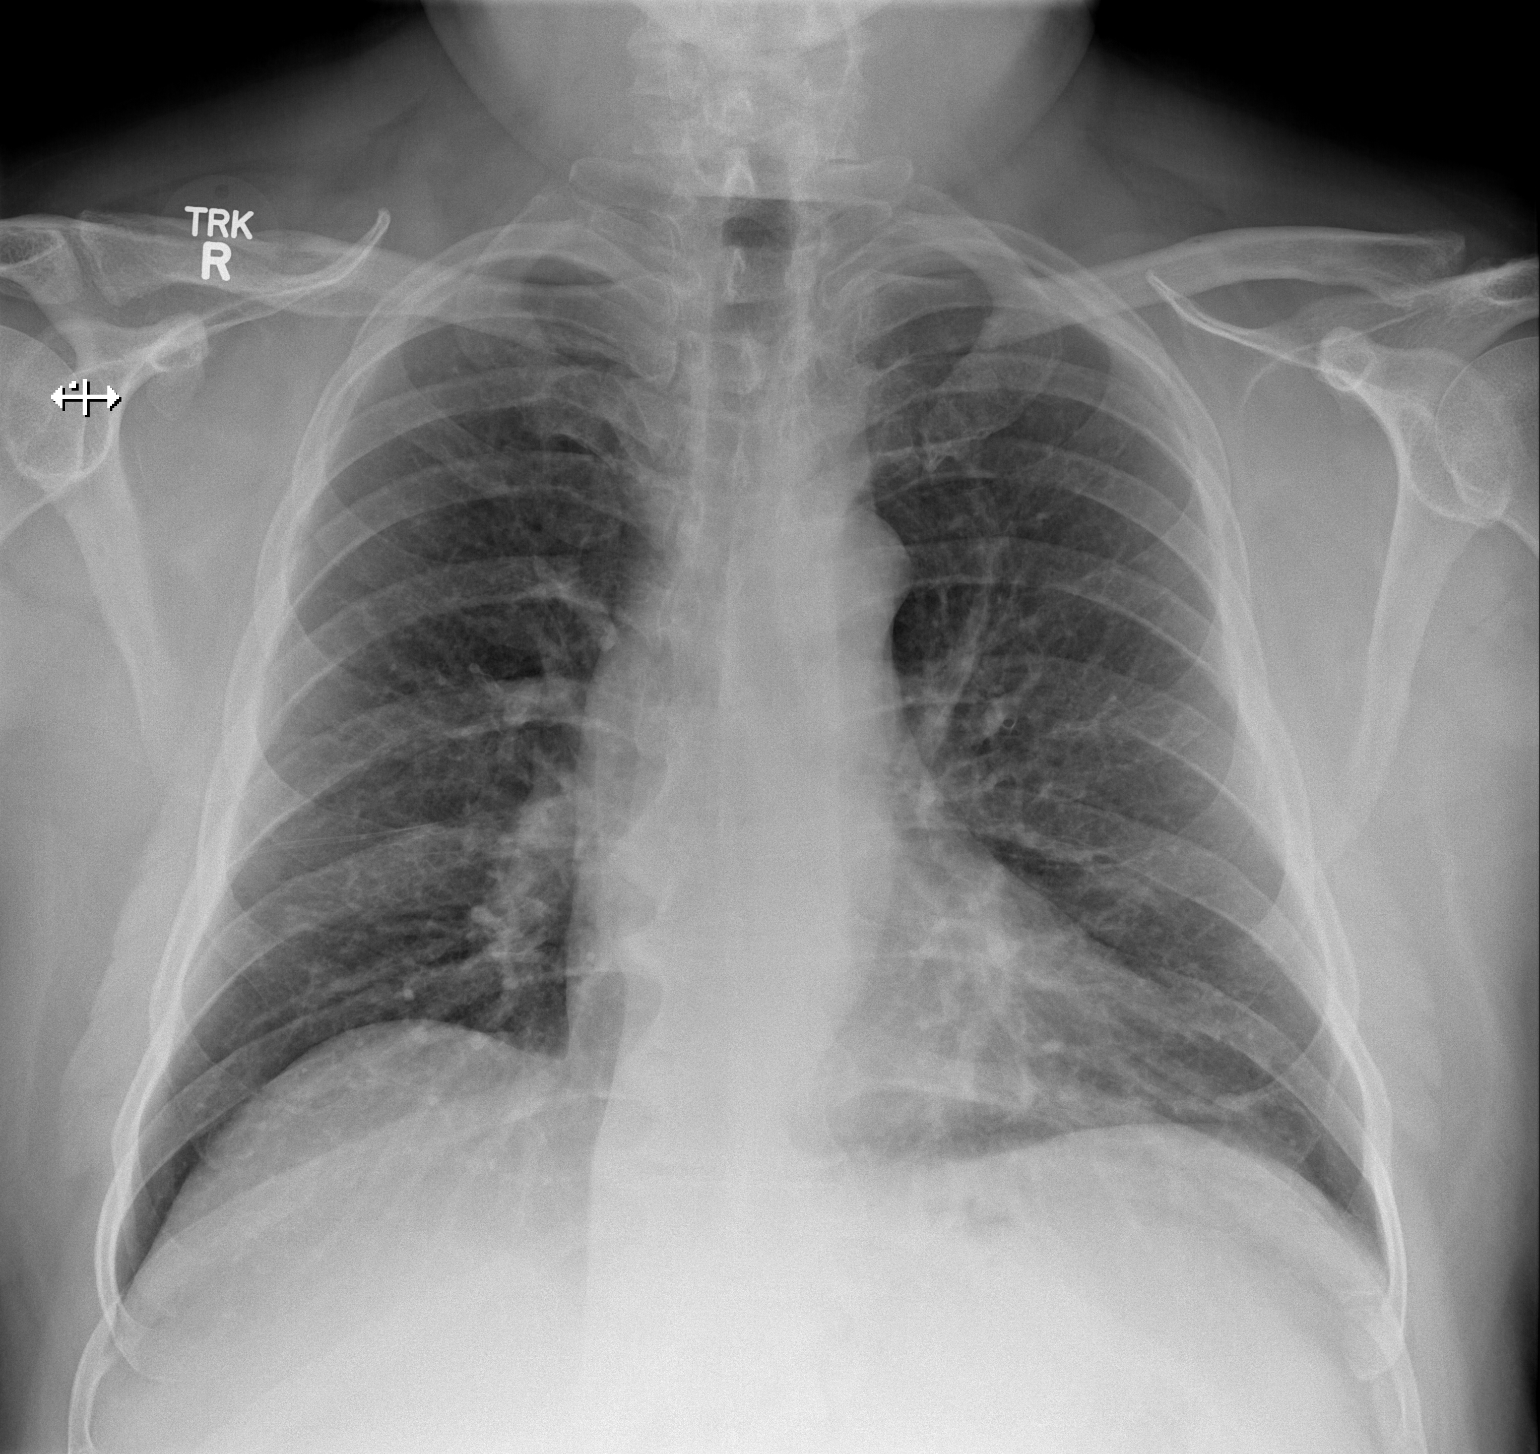

[w chest lat]
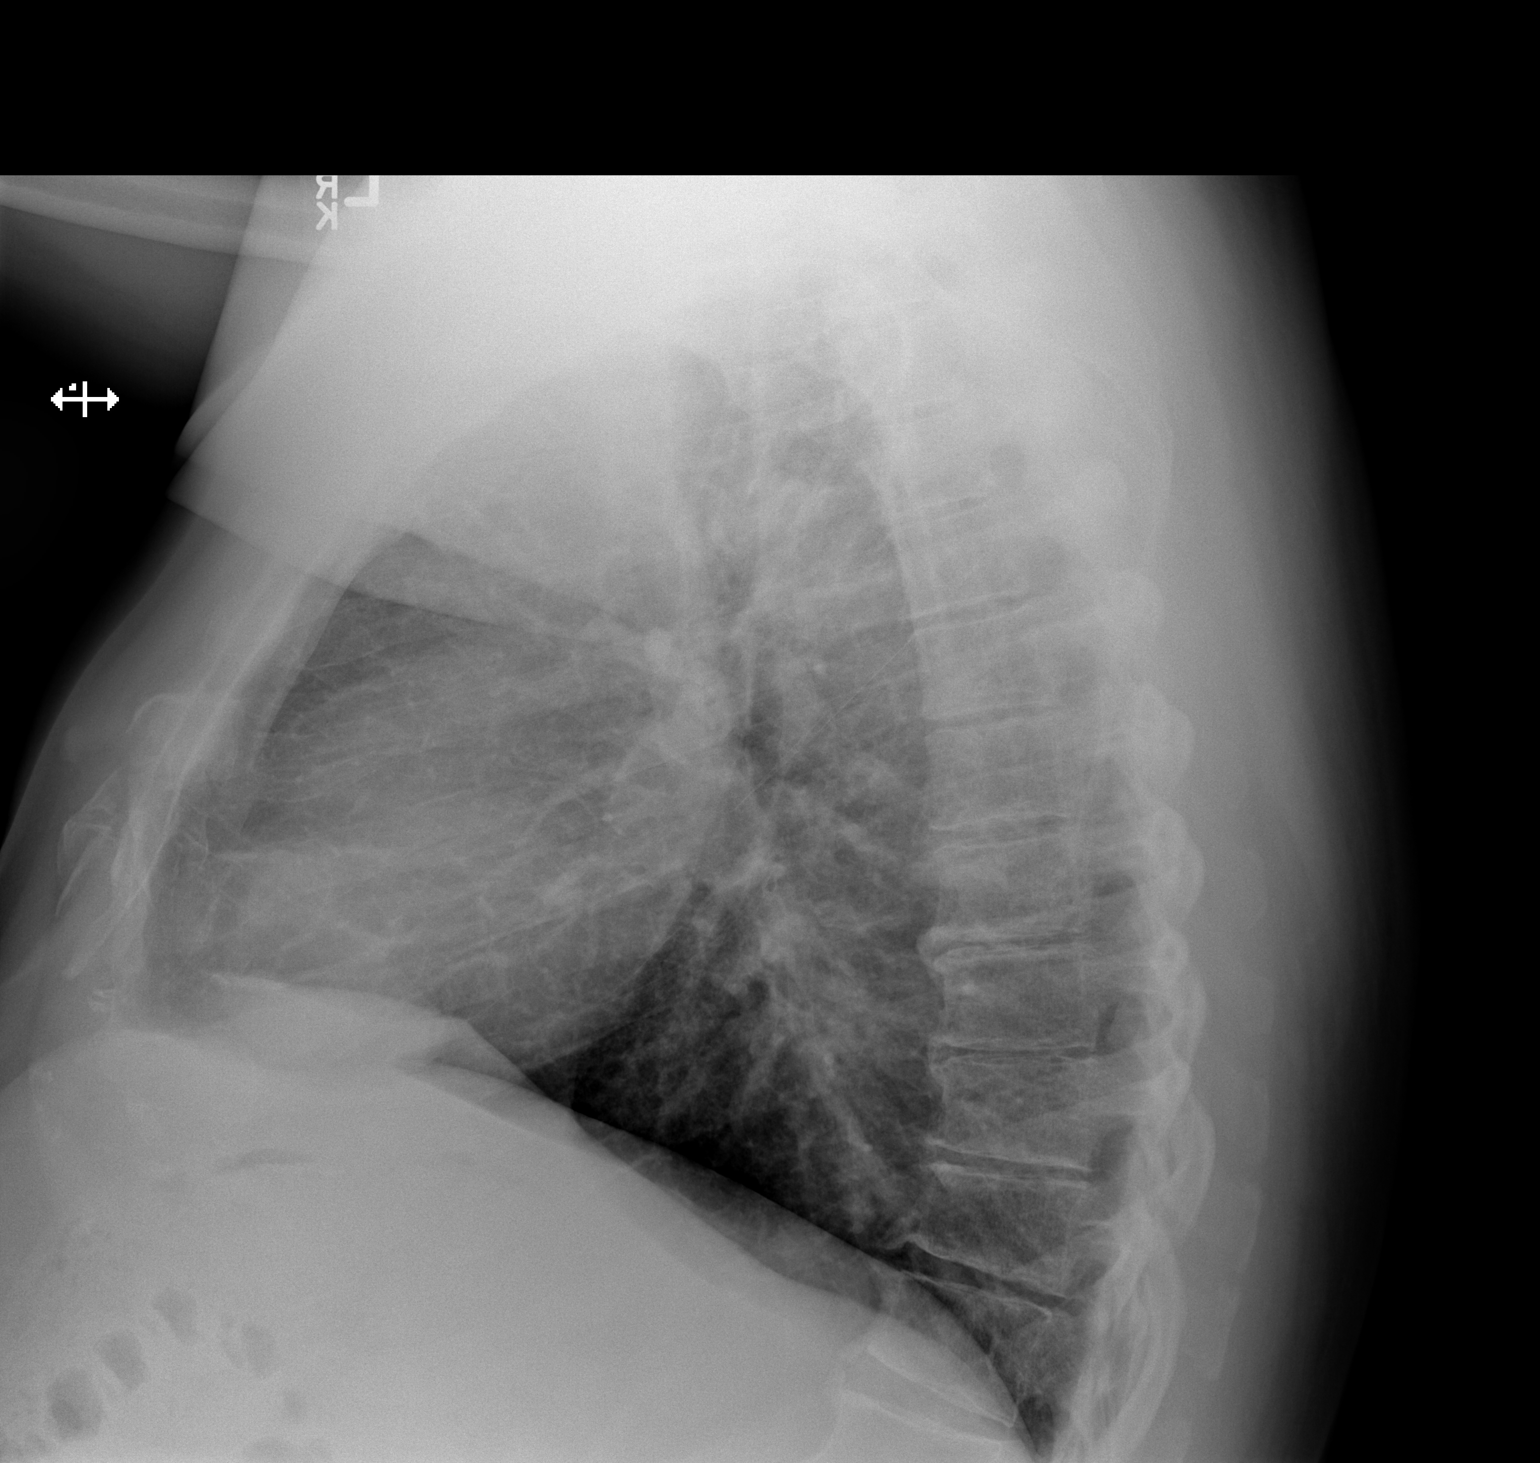

[2 of 2 positions shown; findings below may reference images not displayed]

FINDINGS: The heart size and pulmonary vascularity are normal. No infiltrates
or effusions. Slight peribronchial thickening suggestive of
bronchitis which could be acute or chronic. No acute osseous
abnormality.
IMPRESSION: Bronchitic changes which could be acute or chronic.

## 2018-09-11 ENCOUNTER — Institutional Professional Consult (permissible substitution): Payer: Self-pay | Admitting: Neurology

## 2018-10-30 ENCOUNTER — Encounter: Payer: Self-pay | Admitting: Neurology

## 2018-10-30 ENCOUNTER — Ambulatory Visit (INDEPENDENT_AMBULATORY_CARE_PROVIDER_SITE_OTHER): Payer: No Typology Code available for payment source | Admitting: Neurology

## 2018-10-30 VITALS — BP 138/97 | HR 94 | Ht 71.0 in | Wt 273.0 lb

## 2018-10-30 DIAGNOSIS — R351 Nocturia: Secondary | ICD-10-CM

## 2018-10-30 DIAGNOSIS — E669 Obesity, unspecified: Secondary | ICD-10-CM | POA: Diagnosis not present

## 2018-10-30 DIAGNOSIS — G4719 Other hypersomnia: Secondary | ICD-10-CM | POA: Diagnosis not present

## 2018-10-30 DIAGNOSIS — G4733 Obstructive sleep apnea (adult) (pediatric): Secondary | ICD-10-CM | POA: Diagnosis not present

## 2018-10-30 NOTE — Progress Notes (Signed)
Subjective:    Patient ID: Jerry Meza is a 63 y.o. male.  HPI     Jerry Age, MD, PhD [facility] [address], [address].O. [place], [place] [address]  Dear Jerry Meza,   I saw your patient, Jerry Meza, upon your kind request in the sleep clinic today for initial consultation of his sleep disorder, in particular, concern for underlying obstructive sleep apnea. The patient is accompanied by his wife today. As you know, Jerry Meza is a 63 year old right-handed gentleman with an underlying medical history of seasonal allergies, history of low testosterone, hypertension, hyperlipidemia, reflux disease, asthma, anxiety, hypothyroidism, and obesity, who reports snoring and excessive daytime somnolence. I reviewed Jerry Meza records which you kindly included. He snores and has witnessed apneas per wife. He was diagnosed with obstructive sleep apnea in the past but did not pursue CPAP therapy at the time. Prior sleep study results are not available for my review today. He reports a recent diagnosis of prostate cancer. He is scheduled for surgery at the [place] on 11/22/2018. He quit smoking, he drinks alcohol in the form of beer maybe 2 or 3 per week, drinks caffeine in the form of coffee. He lives with his wife, they have 3 children. He is on disability. His Epworth sleepiness score is 9 out of 24, fatigue score is 27 out of 63. His bedtime is between 10 and midnight, rise time around 10 AM. They have 2 dogs in the household. He is not aware of any family history of OSA. He had asthma as a child. He denies morning headaches and has nocturia about once per average night. He used to work nights up until 2018. He had neck surgery in 2014 under Dr. Hal Meza.  His Past Medical History Is Significant For: Past Medical History:  Diagnosis Date  . Anxiety   . Asthma    Hx: of as a child  . GERD (gastroesophageal reflux disease)   . Hemorrhoids    Hx: of  . High cholesterol     Hx: of  . Hypertension   . Hypothyroidism   . Low testosterone    Hx; of  . Numbness and tingling in right hand    Hx: of  . Prostate cancer (Jerry Meza)   . Seasonal allergies    Hx: of    His Past Surgical History Is Significant For: Past Surgical History:  Procedure Laterality Date  . ANTERIOR CERVICAL DECOMP/DISCECTOMY FUSION N/A 07/18/2013   Procedure: CERVICAL FOUR-FIVE,CERVICALFIVE-SIX,CERVICAL SIX-SEVEN ANTERIOR CERVICAL DECOMPRESSION /DISCECTOMY/FUSION;  Surgeon: Jerry Ghee, MD;  Location: [facility];  Service: Neurosurgery;  Laterality: N/A;  . COLONOSCOPY W/ BIOPSIES AND POLYPECTOMY     Hx; of  . HERNIA REPAIR      His Family History Is Significant For: Family History  Problem Relation Meza of Onset  . Cancer Mother   . Hyperlipidemia Mother   . Cancer Father   . Alzheimer's disease Father   . Cancer Other     His Social History Is Significant For: Social History   Socioeconomic History  . Marital status: Married    Spouse name: Not on file  . Number of children: Not on file  . Years of education: Not on file  . Highest education level: Not on file  Occupational History  . Not on file  Social Needs  . Financial resource strain: Not on file  . Food insecurity:    Worry: Not on file    Inability: Not on  file  . Transportation needs:    Medical: Not on file    Non-medical: Not on file  Tobacco Use  . Smoking status: Never Smoker  . Smokeless tobacco: Former Systems developer    Types: Snuff  Substance and Sexual Activity  . Alcohol use: Yes    Comment: daily  . Drug use: No  . Sexual activity: Not on file  Lifestyle  . Physical activity:    Days per week: Not on file    Minutes per session: Not on file  . Stress: Not on file  Relationships  . Social connections:    Talks on phone: Not on file    Gets together: Not on file    Attends religious service: Not on file    Active member of club or organization: Not on file    Attends meetings of clubs or  organizations: Not on file    Relationship status: Not on file  Other Topics Concern  . Not on file  Social History Narrative  . Not on file    His Allergies Are:  Allergies  Allergen Reactions  . Penicillins Hives  :   His Current Medications Are:  Outpatient Encounter Medications as of 10/30/2018  Medication Sig  . atorvastatin (LIPITOR) 40 MG tablet Take 40 mg by mouth at bedtime.  . citalopram (CELEXA) 20 MG tablet Take 20 mg by mouth every morning.  . fluticasone (FLONASE) 50 MCG/ACT nasal spray Place 2 sprays into the nose as needed for rhinitis.  Marland Kitchen gabapentin (NEURONTIN) 300 MG capsule Take 600 mg by mouth 2 (two) times daily.  Marland Kitchen ibuprofen (ADVIL,MOTRIN) 800 MG tablet Take 800 mg by mouth every 8 (eight) hours as needed.  Marland Kitchen levothyroxine (SYNTHROID, LEVOTHROID) 25 MCG tablet Take 25 mcg by mouth daily before breakfast.  . lisinopril (PRINIVIL,ZESTRIL) 20 MG tablet Take 10 mg by mouth daily.  Marland Kitchen loratadine (CLARITIN) 10 MG tablet Take 10 mg by mouth daily.  . Multiple Vitamin (MULTIVITAMIN) tablet Take 1 tablet by mouth daily.  . Omega-3 Fatty Acids (FISH OIL) 1000 MG CAPS Take 2,000 mg by mouth 2 (two) times daily.   . Zinc 50 MG TABS Take 1 tablet by mouth daily.  . [DISCONTINUED] fexofenadine (ALLEGRA) 180 MG tablet Take 180 mg by mouth daily.  . [DISCONTINUED] ibuprofen (ADVIL,MOTRIN) 200 MG tablet Take 400 mg by mouth daily as needed for pain.  . [DISCONTINUED] lisinopril (PRINIVIL,ZESTRIL) 5 MG tablet Take 5 mg by mouth daily.  . [DISCONTINUED] oxyCODONE-acetaminophen (PERCOCET) 10-325 MG per tablet Take 1 tablet by mouth every 4 (four) hours as needed for pain.  . [DISCONTINUED] testosterone cypionate (DEPOTESTOTERONE CYPIONATE) 200 MG/ML injection Inject 200 mg into the muscle every 14 (fourteen) days.  . [DISCONTINUED] vitamin C (ASCORBIC ACID) 500 MG tablet Take 500 mg by mouth daily.   No facility-administered encounter medications on file as of 10/30/2018.    :  Review of Systems:  Out of a complete 14 point review of systems, all are reviewed and negative with the exception of these symptoms as listed below: Review of Systems  Neurological:       Pt presents today to discuss his sleep. Pt has had a sleep study in the past but did not pursue cpap treatment because insurance did not cover it. Pt does endorse snoring. Pt has been diagnosed with prostate cancer.  Epworth Sleepiness Scale 0= would never doze 1= slight chance of dozing 2= moderate chance of dozing 3= high chance of dozing  Sitting  and reading: 2 Watching TV: 0 Sitting inactive in a public place (ex. Theater or meeting): 0 As a passenger in a car for an hour without a break: 1 Lying down to rest in the afternoon: 3 Sitting and talking to someone: 0 Sitting quietly after lunch (no alcohol): 3 In a car, while stopped in traffic: 0 Total: 9     Objective:  Neurological Exam  Physical Exam Physical Examination:   Vitals:   10/30/18 1541  BP: (!) 138/97  Pulse: 94    General Examination: The patient is a very pleasant 63 y.o. male in no acute distress. He appears well-developed and well-nourished and well groomed.   HEENT: Normocephalic, atraumatic, pupils are equal, round and reactive to light and accommodation. Corrective eyeglasses in place. Extraocular tracking is normal. Face is symmetric. He does have reduced neck mobility, unremarkable anterior neck scar on the left. Airway examination reveals mild mouth dryness, moderate airway crowding secondary to smaller airway entry, tonsils are 1-2+ and uvula slightly thicker. Mallampati is class II. Neck circumference is 19-7/8 inches. Tongue protrudes centrally and palate elevates symmetrically.   Chest: Clear to auscultation without wheezing, rhonchi or crackles noted.  Heart: S1+S2+0, regular and normal without murmurs, rubs or gallops noted.   Abdomen: Soft, non-tender and non-distended.  Skin: Warm and dry without  trophic changes noted.  Musculoskeletal: exam reveals no obvious joint deformities, tenderness or joint swelling or erythema.   Neurologically:  Mental status: The patient is awake, alert and oriented in all 4 spheres. His immediate and remote memory, attention, language skills and fund of knowledge are appropriate. There is no evidence of aphasia, agnosia, apraxia or anomia. Speech is clear with normal prosody and enunciation. Thought process is linear. Mood is normal and affect is normal.  Cranial nerves II - XII are as described above under HEENT exam.  Motor exam: Normal bulk, strength and tone is noted. There is no tremor. Fine motor skills and coordination: grossly intact.  Cerebellar testing: No dysmetria or intention tremor noted.   Sensory exam: intact to light touch. Gait, station and balance: He stands with some difficulty, posture is Meza-appropriate. He walks somewhat slowly and cautiously.          Assessment and Plan:  In summary, Naksh Radi is a very pleasant 62 y.o.-year old male with an underlying medical history of seasonal allergies, history of low testosterone, hypertension, hyperlipidemia, reflux disease, asthma, anxiety, hypothyroidism, and obesity, whose history and physical exam are in keeping with obstructive sleep apnea (OSA). I had a long chat with the patient and his wife about my findings and the diagnosis of OSA, its prognosis and treatment options. We talked about medical treatments, surgical interventions and non-pharmacological approaches. I explained in particular the risks and ramifications of untreated moderate to severe OSA, especially with respect to developing cardiovascular disease down the Road, including congestive heart failure, difficult to treat hypertension, cardiac arrhythmias, or stroke. Even type 2 diabetes has, in part, been linked to untreated OSA. Symptoms of untreated OSA include daytime sleepiness, memory problems, mood irritability and mood  disorder such as depression and anxiety, lack of energy, as well as recurrent headaches, especially morning headaches. We talked about trying to maintain a healthy lifestyle in general, as well as the importance of weight control. I encouraged the patient to eat healthy, exercise daily and keep well hydrated, to keep a scheduled bedtime and wake time routine, to not skip any meals and eat healthy snacks in between meals. I  advised the patient not to drive when feeling sleepy. I recommended the following at this time: sleep study with potential positive airway pressure titration. (We will score hypopneas at 4%).   I explained the sleep test procedure to the patient and also outlined possible surgical and non-surgical treatment options of OSA, including the use of a custom-made dental device (which would require a referral to a specialist dentist or oral surgeon), upper airway surgical options, such as pillar implants, radiofrequency surgery, tongue base surgery, and UPPP (which would involve a referral to an ENT surgeon). Rarely, jaw surgery such as mandibular advancement may be considered.  I also explained the CPAP treatment option to the patient, who indicated that he would be willing to try CPAP if the need arises. I explained the importance of being compliant with PAP treatment, not only for insurance purposes but primarily to improve His symptoms, and for the patient's long term health benefit, including to reduce His cardiovascular risks. I answered all their questions today and the patient and his wife were in agreement. I plan to see him back after the sleep study is completed and encouraged them to call with any interim questions, concerns, problems or updates.   Thank you very much for allowing me to participate in the care of this nice patient. If I can be of any further assistance to you please do not hesitate to call me at [phone].  Sincerely,   Jerry Age, MD, PhD

## 2018-10-30 NOTE — Patient Instructions (Signed)

## 2018-11-05 ENCOUNTER — Ambulatory Visit (INDEPENDENT_AMBULATORY_CARE_PROVIDER_SITE_OTHER): Payer: No Typology Code available for payment source | Admitting: Neurology

## 2018-11-05 DIAGNOSIS — E669 Obesity, unspecified: Secondary | ICD-10-CM

## 2018-11-05 DIAGNOSIS — G4719 Other hypersomnia: Secondary | ICD-10-CM

## 2018-11-05 DIAGNOSIS — G4720 Circadian rhythm sleep disorder, unspecified type: Secondary | ICD-10-CM

## 2018-11-05 DIAGNOSIS — G4733 Obstructive sleep apnea (adult) (pediatric): Secondary | ICD-10-CM | POA: Diagnosis not present

## 2018-11-05 DIAGNOSIS — R351 Nocturia: Secondary | ICD-10-CM

## 2018-11-08 NOTE — Addendum Note (Signed)
Addended by: Star Age on: 11/08/2018 11:15 AM   Modules accepted: Orders

## 2018-11-08 NOTE — Procedures (Signed)
PATIENT'S NAME:  Jerry Meza, Jerry Meza DOB:      [birth year]      MR#:    [id number]     DATE OF RECORDING: 11/05/2018 REFERRING M.D.:  Desiree Hane, [place] Study Performed:   Baseline Polysomnogram HISTORY: 63 year old man with a history of seasonal allergies, history of low testosterone, hypertension, hyperlipidemia, reflux disease, asthma, anxiety, hypothyroidism, and obesity, who reports snoring and excessive daytime somnolence. The patient endorsed the Epworth Sleepiness Scale at 9 points. The patient's weight 273 pounds with a height of 71 (inches), resulting in a BMI of 38.3 kg/m2. The patient's neck circumference measured 19.8 inches.  CURRENT MEDICATIONS: Lipitor, Celexa, Flonase, Neurontin, Advil, Synthroid, Lisinopril, Claritin   PROCEDURE:  This is a multichannel digital polysomnogram utilizing the Somnostar 11.2 system.  Electrodes and sensors were applied and monitored per AASM Specifications.   EEG, EOG, Chin and Limb EMG, were sampled at 200 Hz.  ECG, Snore and Nasal Pressure, Thermal Airflow, Respiratory Effort, CPAP Flow and Pressure, Oximetry was sampled at 50 Hz. Digital video and audio were recorded.      BASELINE STUDY  Lights Out was at 22:28 and Lights On at 05:00.  Total recording time (TRT) was 392.5 minutes, with a total sleep time (TST) of 280 minutes.   The patient's sleep latency was 36 minutes, which is delayed. REM latency was 66.5 minutes, which is borderline low. The sleep efficiency was 71.3 %.     SLEEP ARCHITECTURE: WASO (Wake after sleep onset) was 76.5 minutes with mild to moderate sleep fragmentation noted. There were 34.5 minutes in Stage N1, 121 minutes Stage N2, 74.5 minutes Stage N3 and 50 minutes in Stage REM.  The percentage of Stage N1 was 12.3%, which is increased, Stage N2 was 43.2%, Stage N3 was 26.6% and Stage R (REM sleep) was 17.9%, which is near normal. The arousals were noted as: 32 were spontaneous, 0 were associated with PLMs, 15 were associated with  respiratory events.  RESPIRATORY ANALYSIS:  There were a total of 96 respiratory events:  1 obstructive apneas, 0 central apneas and 6 mixed apneas with a total of 7 apneas and an apnea index (AI) of 1.5 /hour. There were 89 hypopneas with a hypopnea index of 19.1 /hour. The patient also had 0 respiratory event related arousals (RERAs).      The total APNEA/HYPOPNEA INDEX (AHI) was 20.6 /hour and the total RESPIRATORY DISTURBANCE INDEX was 20.6 /hour.  44 events occurred in REM sleep and 104 events in NREM. The REM AHI was 52.8 /hour, versus a non-REM AHI of 13.6. The patient spent 0 minutes of total sleep time in the supine position and 280 minutes in non-supine.. The supine AHI was 0.0 versus a non-supine AHI of 20.6.  OXYGEN SATURATION & C02:  The Wake baseline 02 saturation was 94%, with the lowest being 72%. Time spent below 89% saturation equaled 125 minutes.  PERIODIC LIMB MOVEMENTS: The patient had a total of 0 Periodic Limb Movements.  The Periodic Limb Movement (PLM) index was 0 and the PLM Arousal index was 0/hour.  Audio and video analysis did not show any abnormal or unusual movements, behaviors, phonations or vocalizations. The patient took 1 bathroom break. Mild to moderate snoring was noted. The EKG was in keeping with normal sinus rhythm (NSR).  Post-study, the patient indicated that sleep was the same as usual.   IMPRESSION:  1. Obstructive Sleep Apnea (OSA) 2. Dysfunctions associated with sleep stages or arousal from sleep  RECOMMENDATIONS:  1. This  study demonstrates moderate to severe obstructive sleep apnea, with a total AHI of 20.6/hour, REM AHI of 52.8/hour; the absence of supine sleep may underestimate the sleep disordered breathing and O2 nadir. Desaturation nadir was 72%. Treatment with positive airway pressure in the form of CPAP is recommended. This will require a full night titration study to optimize therapy. Other treatment options may include avoidance of supine  sleep position along with weight loss, upper airway or jaw surgery in selected patients or the use of an oral appliance in certain patients. ENT evaluation and/or consultation with a maxillofacial surgeon or dentist may be feasible in some instances.    2. Please note that untreated obstructive sleep apnea may carry additional perioperative morbidity. Patients with significant obstructive sleep apnea should receive perioperative PAP therapy and the surgeons and particularly the anesthesiologist should be informed of the diagnosis and the severity of the sleep disordered breathing. 3. This study shows sleep fragmentation and abnormal sleep stage percentages; these are nonspecific findings and per se do not signify an intrinsic sleep disorder or a cause for the patient's sleep-related symptoms. Causes include (but are not limited to) the first night effect of the sleep study, circadian rhythm disturbances, medication effect or an underlying mood disorder or medical problem.  4. The patient should be cautioned not to drive, work at heights, or operate dangerous or heavy equipment when tired or sleepy. Review and reiteration of good sleep hygiene measures should be pursued with any patient. 5. The patient will be seen in follow-up in the sleep clinic at [facility] for discussion of the test results, symptom and treatment compliance review, further management strategies, etc. The referring provider will be notified of the test results.  I certify that I have reviewed the entire raw data recording prior to the issuance of this report in accordance with the Standards of Accreditation of the American Academy of Sleep Medicine (AASM)   Star Age, MD, PhD Diplomat, American Board of Neurology and Sleep Medicine (Neurology and Sleep Medicine)

## 2018-11-08 NOTE — Progress Notes (Signed)
Patient referred by Desiree Hane, PA from the [place], seen by me on 10/30/18, diagnostic PSG on 11/05/18.   Please call and notify the patient that the recent sleep study showed moderate to severe obstructive sleep apnea. I recommend treatment for this in the form of CPAP. This will require a repeat sleep study for proper titration and mask fitting and correct monitoring of the oxygen saturations. Please explain to patient. I have placed an order in the chart. Thanks.  Star Age, MD, PhD [facility])

## 2018-11-11 ENCOUNTER — Telehealth: Payer: Self-pay

## 2018-11-11 NOTE — Telephone Encounter (Signed)
I called pt. I advised pt that Dr. Rexene Alberts reviewed their sleep study results and found that pt has moderate to severe osa and recommends that pt be treated with a cpap. Dr. Rexene Alberts recommends that pt return for a repeat sleep study in order to properly titrate the cpap and ensure a good mask fit. Pt is agreeable to returning for a titration study. I advised pt that our sleep lab will file with pt's insurance and call pt to schedule the sleep study when we hear back from the pt's insurance regarding coverage of this sleep study. Pt verbalized understanding of results. Pt had no questions at this time but was encouraged to call back if questions arise.  Pt reports that he is having prostate surgery on 11/22/18 and needs this study done ASAP, if possible.

## 2018-11-11 NOTE — Telephone Encounter (Signed)
-----   Message from Star Age, MD sent at 11/08/2018 11:15 AM EST ----- Patient referred by Desiree Hane, PA from the [place], seen by me on 10/30/18, diagnostic PSG on 11/05/18.   Please call and notify the patient that the recent sleep study showed moderate to severe obstructive sleep apnea. I recommend treatment for this in the form of CPAP. This will require a repeat sleep study for proper titration and mask fitting and correct monitoring of the oxygen saturations. Please explain to patient. I have placed an order in the chart. Thanks.  Star Age, MD, PhD [facility])

## 2018-12-31 ENCOUNTER — Telehealth: Payer: Self-pay

## 2018-12-31 DIAGNOSIS — G4733 Obstructive sleep apnea (adult) (pediatric): Secondary | ICD-10-CM

## 2018-12-31 NOTE — Telephone Encounter (Signed)
Since we are unable to do in lab sleep studies currently, I would like to get the patient started on AutoPap therapy at home based on his baseline sleep study from January 2020.   Kristen: Pls process order and notify/explain to the patient and set up FU in 10 weeks with me or one of our NPs.

## 2018-12-31 NOTE — Telephone Encounter (Signed)
You have ordered a CPAP study for patient. Due to sleep lab being closed due to Covid 19, would you like to order auto CPAP at home? If so, let sleep lab know to cancel in lab titration study and then you can order auto CPAP for patient. Thanks! 

## 2018-12-31 NOTE — Addendum Note (Signed)
Addended by: Star Age on: 12/31/2018 02:42 PM   Modules accepted: Orders

## 2019-01-02 NOTE — Telephone Encounter (Signed)
I called pt to discuss getting pt started on auto pap therapy. No answer, left a message asking him to call me back.

## 2019-01-02 NOTE — Telephone Encounter (Signed)
Pt has returned the call to RN Kristen, please call °

## 2019-01-02 NOTE — Telephone Encounter (Signed)
I called pt. Dr. Rexene Alberts recommends that pt start an auto pap at home since we are unable to do the cpap titration study. I reviewed PAP compliance expectations with the pt. Pt is agreeable to starting an auto-PAP. I advised pt that an order will be sent to a DME, [facility], and [facility] will call the pt within about one week after they file with the pt's insurance. [facility] will show the pt how to use the machine, fit for masks, and troubleshoot the auto-PAP if needed. A follow up appt was made for insurance purposes with Dr. Rexene Alberts on 04/08/2019 at 10:30am. Pt verbalized understanding to arrive 15 minutes early and bring their auto-PAP. I advised pt that [facility] may not accept his [facility] insurance and pt may need to contact the [place] on further instruction on how to get his auto pap. Pt will call the [facility] and let me know. Pt verbalized understanding of recommendations. Pt had no questions at this time but was encouraged to call back if questions arise. I have sent the order to [facility] and have received confirmation that they have received the order.

## 2019-04-07 ENCOUNTER — Telehealth: Payer: Self-pay | Admitting: Neurology

## 2019-04-07 NOTE — Telephone Encounter (Signed)
I called pt. He has started his auto pap but cannot make his appt tomorrow. He was r/s for 04/16/19 at 11:00am and understands our COVID policies and procedures.

## 2019-04-07 NOTE — Telephone Encounter (Signed)
Due to current COVID 19 pandemic, our office is severely reducing in office visits until further notice, in order to minimize the risk to our patients and healthcare providers.   I called patient regarding his 6/30 appointment to advise patient to come in office unless he would prefer a virtual visit. No answer and voicemail box full. I also called patient's wife at contact number listed in chart, no answer and no voicemail available.

## 2019-04-08 ENCOUNTER — Ambulatory Visit: Payer: Self-pay | Admitting: Neurology

## 2019-04-16 ENCOUNTER — Ambulatory Visit: Payer: Self-pay | Admitting: Neurology

## 2021-04-30 ENCOUNTER — Encounter: Payer: Self-pay | Admitting: Gastroenterology

## 2021-12-06 ENCOUNTER — Inpatient Hospital Stay: Payer: Self-pay | Attending: Genetic Counselor | Admitting: Genetic Counselor

## 2024-07-10 ENCOUNTER — Other Ambulatory Visit (HOSPITAL_BASED_OUTPATIENT_CLINIC_OR_DEPARTMENT_OTHER): Payer: Self-pay | Admitting: Internal Medicine

## 2024-07-10 DIAGNOSIS — M4807 Spinal stenosis, lumbosacral region: Secondary | ICD-10-CM

## 2024-07-10 DIAGNOSIS — Z0189 Encounter for other specified special examinations: Secondary | ICD-10-CM

## 2024-07-17 ENCOUNTER — Ambulatory Visit (INDEPENDENT_AMBULATORY_CARE_PROVIDER_SITE_OTHER)
Admission: RE | Admit: 2024-07-17 | Discharge: 2024-07-17 | Disposition: A | Discharge status: Home / Self Care | Source: Ambulatory Visit | Attending: Internal Medicine | Admitting: Internal Medicine

## 2024-07-17 DIAGNOSIS — M4807 Spinal stenosis, lumbosacral region: Secondary | ICD-10-CM | POA: Diagnosis not present

## 2024-07-17 DIAGNOSIS — Z0189 Encounter for other specified special examinations: Secondary | ICD-10-CM

## 2025-01-15 ENCOUNTER — Ambulatory Visit: Admitting: Neurology

## 2025-02-10 ENCOUNTER — Ambulatory Visit: Admitting: Neurology
